# Patient Record
Sex: Female | Born: 1995 | Race: White | Hispanic: No | Marital: Married | Smoking: Never smoker
Health system: Southern US, Community
[De-identification: ages and names within clinical notes are randomized; demographics above are authoritative.]

## PROBLEM LIST (undated history)

## (undated) ENCOUNTER — Inpatient Hospital Stay (HOSPITAL_COMMUNITY): Payer: Self-pay

## (undated) DIAGNOSIS — Z789 Other specified health status: Secondary | ICD-10-CM

## (undated) HISTORY — PX: TUBAL LIGATION: SHX77

## (undated) HISTORY — PX: NO PAST SURGERIES: SHX2092

---
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
## 2018-10-30 DIAGNOSIS — D509 Iron deficiency anemia, unspecified: Secondary | ICD-10-CM | POA: Diagnosis not present

## 2022-05-13 ENCOUNTER — Ambulatory Visit: Payer: Self-pay | Admitting: Internal Medicine

## 2022-05-22 ENCOUNTER — Encounter: Payer: Self-pay | Admitting: Internal Medicine

## 2022-05-22 ENCOUNTER — Ambulatory Visit: Payer: Self-pay | Admitting: Internal Medicine

## 2022-05-22 VITALS — BP 110/70 | HR 62 | Temp 97.8°F | Resp 18 | Ht 64.0 in | Wt 187.0 lb

## 2022-05-22 DIAGNOSIS — Z6830 Body mass index (BMI) 30.0-30.9, adult: Secondary | ICD-10-CM | POA: Insufficient documentation

## 2022-05-22 DIAGNOSIS — Z6832 Body mass index (BMI) 32.0-32.9, adult: Secondary | ICD-10-CM

## 2022-05-22 MED ORDER — PHENTERMINE HCL 15 MG PO CAPS: 15.0000 mg | ORAL_CAPSULE | ORAL | 1 refills | Status: DC

## 2022-05-22 NOTE — Progress Notes (Signed)
New Patient Office Visit  Subjective    Patient ID: Jessica Stone, female    DOB: [birth year]  Age: 27 y.o. MRN: [id number]  CC:  Chief Complaint  Patient presents with   New Patient (Initial Visit)    NEW PATIENT    HPI Jessica Stone presents to establish care With [place].  She says that she started gaining weight in spite of watching her diet, trying keto diet.  She used to take phentermine without much side effect and wanted to restart that medicine again.  She has recently gained weight and her BMI is 32.  Outpatient Encounter Medications as of 05/22/2022  Medication Sig   ciprofloxacin (CIPRO) 500 MG tablet Take 1 tablet (500 mg total) by mouth 2 (two) times daily.   ferrous sulfate 325 (65 FE) MG EC tablet Take 325 mg by mouth daily with breakfast.   ibuprofen (ADVIL,MOTRIN) 600 MG tablet Take 600 mg by mouth every 6 (six) hours as needed for headache.   Multiple Vitamin (MULTIVITAMIN WITH MINERALS) TABS tablet Take 1 tablet by mouth daily.   zinc oxide (BALMEX) 11.3 % CREA cream Apply 1 application topically daily as needed (For rash.).   No facility-administered encounter medications on file as of 05/22/2022.    Past Medical History:  Diagnosis Date   Medical history non-contributory     Past Surgical History:  Procedure Laterality Date   NO PAST SURGERIES     TUBAL LIGATION      Family History  Problem Relation Age of Onset   Alcohol abuse Neg Hx    Arthritis Neg Hx    Asthma Neg Hx    Birth defects Neg Hx    Cancer Neg Hx    COPD Neg Hx    Depression Neg Hx    Drug abuse Neg Hx    Diabetes Neg Hx    Early death Neg Hx    Hearing loss Neg Hx    Heart disease Neg Hx    Hyperlipidemia Neg Hx    Hypertension Neg Hx    Kidney disease Neg Hx    Learning disabilities Neg Hx    Mental illness Neg Hx    Mental retardation Neg Hx    Miscarriages / Stillbirths Neg Hx    Stroke Neg Hx    Vision loss Neg Hx    Varicose Veins Neg Hx     Social History    Socioeconomic History   Marital status: Married    Spouse name: Not on file   Number of children: Not on file   Years of education: Not on file   Highest education level: Not on file  Occupational History   Not on file  Tobacco Use   Smoking status: Never   Smokeless tobacco: Never  Vaping Use   Vaping Use: Never used  Substance and Sexual Activity   Alcohol use: No   Drug use: No   Sexual activity: Yes    Birth control/protection: None  Other Topics Concern   Not on file  Social History Narrative   Not on file   Social Determinants of Health   Financial Resource Strain: Not on file  Food Insecurity: Not on file  Transportation Needs: Not on file  Physical Activity: Not on file  Stress: Not on file  Social Connections: Not on file  Intimate Partner Violence: Not on file    Review of Systems  Constitutional: Negative.   HENT: Negative.    Respiratory: Negative.  Cardiovascular: Negative.   Gastrointestinal: Negative.   Neurological: Negative.         Objective    BP 110/70 (BP Location: Right Arm, Patient Position: Sitting, Cuff Size: Normal)   Pulse 62   Temp 97.8 F (36.6 C)   Resp 18   LMP 04/21/2022   SpO2 97%   Physical Exam Constitutional:      Appearance: Normal appearance.  Cardiovascular:     Rate and Rhythm: Normal rate and regular rhythm.     Heart sounds: Normal heart sounds.  Pulmonary:     Effort: Pulmonary effort is normal.     Breath sounds: Normal breath sounds.  Neurological:     General: No focal deficit present.     Mental Status: She is alert and oriented to person, place, and time.         Assessment & Plan:   Problem List Items Addressed This Visit       Other   BMI 32.0-32.9,adult - Primary    She will continue watch her diet and exercise. I will add phentermine       Return in about 1 month (around 06/21/2022).   Garwin Brothers, MD

## 2022-05-22 NOTE — Assessment & Plan Note (Signed)
She will continue watch her diet and exercise. I will add phentermine

## 2022-06-21 ENCOUNTER — Ambulatory Visit: Payer: Self-pay | Admitting: Internal Medicine

## 2022-06-21 ENCOUNTER — Encounter: Payer: Self-pay | Admitting: Internal Medicine

## 2022-06-21 VITALS — BP 116/78 | HR 99 | Temp 97.9°F | Resp 18 | Ht 64.0 in | Wt 176.2 lb

## 2022-06-21 DIAGNOSIS — Z6830 Body mass index (BMI) 30.0-30.9, adult: Secondary | ICD-10-CM

## 2022-06-21 MED ORDER — PHENTERMINE HCL 15 MG PO CAPS: 15.0000 mg | ORAL_CAPSULE | ORAL | 2 refills | Status: DC

## 2022-06-21 NOTE — Progress Notes (Addendum)
   Office Visit  Subjective   Patient ID: Jessica Stone   DOB: [birth year]   Age: 27 y.o.   MRN: [id number]   Chief Complaint Chief Complaint  Patient presents with   Follow-up    BMI 32.0-32.9, adult     History of Present Illness 27 years old female is here for follow up. She says in the beginning she felt more [place] so she started taking it afternoon and is doing fine. She denies any side effects. She is monitoring her diet and lost weight. Her BMI is 30 from 32.    Past Medical History Past Medical History:  Diagnosis Date   Medical history non-contributory      Allergies No Known Allergies   Review of Systems Review of Systems  Constitutional: Negative.   HENT: Negative.    Respiratory: Negative.    Cardiovascular: Negative.   Gastrointestinal: Negative.   Neurological: Negative.        Objective:    Vitals BP 116/78 (BP Location: Left Arm, Patient Position: Sitting, Cuff Size: Normal)   Pulse 99   Temp 97.9 F (36.6 C)   Resp 18   Ht 5\' 4"  (1.626 m)   Wt 176 lb 4 oz (79.9 kg)   LMP 06/17/2022   SpO2 98%   BMI 30.25 kg/m    Physical Examination Physical Exam Constitutional:      Appearance: Normal appearance.  Cardiovascular:     Rate and Rhythm: Normal rate and regular rhythm.     Heart sounds: Normal heart sounds.  Pulmonary:     Effort: Pulmonary effort is normal.     Breath sounds: Normal breath sounds.  Neurological:     General: No focal deficit present.     Mental Status: She is alert and oriented to person, place, and time.        Assessment & Plan:   BMI 30.0-30.9,adult She has lost weight with BMI decrease from 32 to 30 today. No side effect and she will continue to monitor her heart rate BP.    Return in about 3 months (around 09/21/2022).   Eloisa Northern, MD

## 2022-06-21 NOTE — Assessment & Plan Note (Signed)
She has lost weight with BMI decrease from 32 to 30 today. No side effect and she will continue to monitor her heart rate BP.

## 2022-07-19 ENCOUNTER — Other Ambulatory Visit: Payer: Self-pay | Admitting: Internal Medicine

## 2022-07-19 MED ORDER — PHENTERMINE HCL 37.5 MG PO CAPS: 37.5000 mg | ORAL_CAPSULE | ORAL | 0 refills | Status: AC

## 2022-09-20 ENCOUNTER — Ambulatory Visit: Payer: Medicaid Other | Admitting: Internal Medicine
